# Patient Record
Sex: Female | Born: 1990 | Race: White | Hispanic: No | Marital: Single | Smoking: Never smoker
Health system: Southern US, Community
[De-identification: ages and names within clinical notes are randomized; demographics above are authoritative.]

## PROBLEM LIST (undated history)

## (undated) DIAGNOSIS — M26609 Unspecified temporomandibular joint disorder, unspecified side: Secondary | ICD-10-CM

## (undated) HISTORY — PX: TONSILLECTOMY AND ADENOIDECTOMY: SUR1326

## (undated) HISTORY — PX: TONSILLECTOMY: SUR1361

## (undated) HISTORY — PX: ARTHROSCOPIC REPAIR ACL: SUR80

---

## 2015-10-10 ENCOUNTER — Other Ambulatory Visit: Payer: Self-pay | Admitting: Family Medicine

## 2015-10-10 ENCOUNTER — Emergency Department (INDEPENDENT_AMBULATORY_CARE_PROVIDER_SITE_OTHER)
Admission: EM | Admit: 2015-10-10 | Discharge: 2015-10-10 | Disposition: A | Discharge status: Home / Self Care | Payer: 59 | Source: Home / Self Care | Attending: Family Medicine | Admitting: Family Medicine

## 2015-10-10 ENCOUNTER — Encounter: Payer: Self-pay | Admitting: Emergency Medicine

## 2015-10-10 DIAGNOSIS — R509 Fever, unspecified: Secondary | ICD-10-CM | POA: Diagnosis not present

## 2015-10-10 DIAGNOSIS — K1121 Acute sialoadenitis: Secondary | ICD-10-CM

## 2015-10-10 HISTORY — DX: Unspecified temporomandibular joint disorder, unspecified side: M26.609

## 2015-10-10 LAB — POCT CBC W AUTO DIFF (K'VILLE URGENT CARE)

## 2015-10-10 MED ORDER — CLINDAMYCIN HCL 300 MG PO CAPS: 300.0000 mg | ORAL_CAPSULE | Freq: Three times a day (TID) | ORAL | Status: DC

## 2015-10-10 NOTE — Discharge Instructions (Signed)
Increase fluid intake.  May begin applying warm compress several times daily.  May take Tylenol for pain, or Ibuprofen 200mg , 4 tabs every 8 hours with food.   If symptoms become significantly worse during the night or over the weekend, proceed to the local emergency room.    Parotitis Parotitis is soreness and inflammation of one or both parotid glands. The parotid glands produce saliva. They are located on each side of the face, below and in front of the earlobes. The saliva produced comes out of tiny openings (ducts) inside the cheeks. In most cases, parotitis goes away over time or with treatment. If your parotitis is caused by certain long-term (chronic) diseases, it may come back again.  CAUSES  Parotitis can be caused by:  Viral infections. Mumps is one viral infection that can cause parotitis.  Bacterial infections.  Blockage of the salivary ducts due to a salivary stone.  Narrowing of the salivary ducts.  Swelling of the salivary ducts.  Dehydration.  Autoimmune conditions, such as sarcoidosis or Sjogren syndrome.  Air from activities such as scuba diving, glass blowing, or playing an instrument (rare).  Human immunodeficiency virus (HIV) or acquired immunodeficiency syndrome (AIDS).  Tuberculosis. SIGNS AND SYMPTOMS   The ears may appear to be pushed up and out from their normal position.  Redness (erythema) of the skin over the parotid glands.  Pain and tenderness over the parotid glands.  Swelling in the parotid gland area.  Yellowish-white fluid (pus) coming from the ducts inside the cheeks.  Dry mouth.  Bad taste in the mouth. DIAGNOSIS  Your health care provider may determine that you have parotitis based on your symptoms and a physical exam. A sample of fluid may also be taken from the parotid gland and tested to find the cause of your infection. X-rays or computed tomography (CT) scans may be taken if your health care provider thinks you might have a  salivary stone blocking your salivary duct. TREATMENT  Treatment varies depending upon the cause of your parotitis. If your parotitis is caused by mumps, no treatment is needed. The condition will go away on its own after 7 to 10 days. In other cases, treatment may include:  Antibiotic medicine if your infection was caused by bacteria.  Pain medicines.  Gland massage.  Eating sour candy to increase your saliva production.  Removal of salivary stones. Your health care provider may flush stones out with fluids or remove them with tweezers.  Surgery to remove the parotid glands. HOME CARE INSTRUCTIONS   If you were prescribed an antibiotic medicine, finish it all even if you start to feel better.  Put warm compresses on the sore area.  Take medicines only as directed by your health care provider.  Drink enough fluids to keep your urine clear or pale yellow. SEEK IMMEDIATE MEDICAL CARE IF:   You have increasing pain or swelling that is not controlled with medicine.  You have a fever. MAKE SURE YOU:  Understand these instructions.  Will watch your condition.  Will get help right away if you are not doing well or get worse.   This information is not intended to replace advice given to you by your health care provider. Make sure you discuss any questions you have with your health care provider.   Document Released: 11/15/2001 Document Revised: 06/16/2014 Document Reviewed: 10/19/2014 Elsevier Interactive Patient Education [employer]2016 [employer] Inc.

## 2015-10-10 NOTE — ED Notes (Signed)
Right jaw pain, swelling for 3 days, worse today has spread into right side of face and neck. Saw PCP today who dx'd TMJ, which she says she has always had but never this severe. Rx for Naproxen 500mg , hasn't helped.

## 2015-10-10 NOTE — ED Notes (Signed)
Peggy Hook @ [facility] advised, viral swab code [phone]#[id number] Mumps, PCR, refrigerated, source

## 2015-10-10 NOTE — ED Provider Notes (Signed)
CSN: [id number]     Arrival date & time 10/10/15  1559 History   First MD Initiated Contact with Patient 10/10/15 1626     Chief Complaint  Patient presents with  . Jaw Pain      HPI Comments: Patient complains of onset of pain around her right ear about 3 days ago.  She visited her PCP who diagnosed TMJ inflammation and prescribed Naproxen 500mg  BID.  She has now developed increased pain and swelling extending to her right side of face and neck.  She has had TMJ problems in the past, but not as severe.  No fever, but she has had chills.  The history is provided by the patient.    Past Medical History  Diagnosis Date  . TMJ (temporomandibular joint syndrome)    Past Surgical History  Procedure Laterality Date  . Tonsillectomy    . Arthroscopic repair acl     Family History  Problem Relation Age of Onset  . Cancer Mother   . Diabetes Father   . Hypertension Father    Social History  Substance Use Topics  . Smoking status: Never Smoker   . Smokeless tobacco: None  . Alcohol Use: Yes   OB History    No data available     Review of Systems  Constitutional: Positive for chills. Negative for fever, diaphoresis, appetite change and fatigue.  HENT: Positive for ear pain and facial swelling. Negative for congestion, dental problem, ear discharge, hearing loss, rhinorrhea, sinus pressure, sore throat, tinnitus and trouble swallowing.   Eyes: Negative.   Respiratory: Negative.   Cardiovascular: Negative.   Gastrointestinal: Negative.   Genitourinary: Negative.   Musculoskeletal: Negative.   Skin: Negative.   Neurological: Negative for headaches.    Allergies  Sulfa antibiotics  Home Medications   Prior to Admission medications   Medication Sig Start Date End Date Taking? Authorizing Provider  amphetamine-dextroamphetamine (ADDERALL XR) 20 MG 24 hr capsule Take 20 mg by mouth daily.   Yes Historical Provider, MD  escitalopram (LEXAPRO) 20 MG tablet Take 20 mg by mouth  daily.   Yes Historical Provider, MD  naproxen (EC NAPROSYN) 500 MG EC tablet Take 500 mg by mouth 2 (two) times daily with a meal.   Yes Historical Provider, MD  clindamycin (CLEOCIN) 300 MG capsule Take 1 capsule (300 mg total) by mouth 3 (three) times daily. 10/10/15   Lattie HawStephen A Lara Palinkas, MD   Meds Ordered and Administered this Visit  Medications - No data to display  BP 136/84 mmHg  Pulse 92  Temp(Src) 99 F (37.2 C) (Oral)  Ht 5\' 7"  (1.702 m)  Wt 158 lb (71.668 kg)  BMI 24.74 kg/m2  SpO2 100%  LMP 09/05/2015 No data found.   Physical Exam  Constitutional: She is oriented to person, place, and time. She appears well-developed and well-nourished. No distress.  HENT:  Head:    Right Ear: Tympanic membrane, external ear and ear canal normal.  Left Ear: Tympanic membrane, external ear and ear canal normal.  Nose: Nose normal.  Mouth/Throat: Oropharynx is clear and moist. No trismus in the jaw.  There is tenderness to palpation and mild swelling over the right TMJ and parotid gland.  There is right cervical adenopathy as noted on diagram.    Eyes: Conjunctivae are normal. Pupils are equal, round, and reactive to light.  Neck: Neck supple.  Cardiovascular: Normal heart sounds.   Pulmonary/Chest: Breath sounds normal.  Abdominal: There is no tenderness.  Neurological: She  is alert and oriented to person, place, and time.  Skin: Skin is warm and dry. No rash noted.  Nursing note and vitals reviewed.   ED Course  Procedures none    Labs Reviewed  OTHER LAB TEST  MUMPS ANTIBODY, IGG  MUMPS ANTIBODY, IGM POCT CBC:  WBC 10.3; LY 16.1; MO 3.2; GR 80.7; Hgb 12.5; Platelets 239       MDM   1. Fever, unspecified   2. Parotitis, acute; with a normal WBC, suspect mumps.  Also consider Stensens duct stone.    Mumps IgG, IgM, and mumps viral PCR (from Stensens duct swab) pending. Begin empiric Clindamycin  TID Increase fluid intake.  May begin applying warm compress several  times daily.  May take Tylenol for pain, or Ibuprofen , 4 tabs every 8 hours with food.   If symptoms become significantly worse during the night or over the weekend, proceed to the local emergency room.  Recheck in 48 hours.    Lattie Haw, MD 10/17/15 [phone]

## 2015-10-11 LAB — MUMPS ANTIBODY, IGG: Mumps IgG: 47.4 AU/mL — ABNORMAL HIGH (ref <9.00)

## 2015-10-13 LAB — MUMPS ANTIBODY, IGM

## 2015-10-15 ENCOUNTER — Telehealth: Payer: Self-pay | Admitting: *Deleted

## 2015-10-17 LAB — OTHER SOLSTAS TEST: COST - Referred Assay: 70172

## 2016-03-31 ENCOUNTER — Emergency Department (INDEPENDENT_AMBULATORY_CARE_PROVIDER_SITE_OTHER)
Admission: EM | Admit: 2016-03-31 | Discharge: 2016-03-31 | Disposition: A | Discharge status: Home / Self Care | Payer: 59 | Source: Home / Self Care | Attending: Family Medicine | Admitting: Family Medicine

## 2016-03-31 ENCOUNTER — Encounter: Payer: Self-pay | Admitting: *Deleted

## 2016-03-31 ENCOUNTER — Emergency Department (INDEPENDENT_AMBULATORY_CARE_PROVIDER_SITE_OTHER): Payer: 59

## 2016-03-31 DIAGNOSIS — R112 Nausea with vomiting, unspecified: Secondary | ICD-10-CM

## 2016-03-31 DIAGNOSIS — R0789 Other chest pain: Secondary | ICD-10-CM | POA: Diagnosis not present

## 2016-03-31 DIAGNOSIS — R05 Cough: Secondary | ICD-10-CM

## 2016-03-31 DIAGNOSIS — K921 Melena: Secondary | ICD-10-CM

## 2016-03-31 DIAGNOSIS — R5383 Other fatigue: Secondary | ICD-10-CM | POA: Diagnosis not present

## 2016-03-31 LAB — POCT CBC W AUTO DIFF (K'VILLE URGENT CARE)

## 2016-03-31 LAB — TSH: TSH: 1.26 mIU/L

## 2016-03-31 MED ORDER — ONDANSETRON 4 MG PO TBDP
4.0000 mg | ORAL_TABLET | Freq: Once | ORAL | Status: AC
  Administered 2016-03-31: 4 mg via ORAL

## 2016-03-31 MED ORDER — ONDANSETRON 4 MG PO TBDP: ORAL_TABLET | ORAL | 0 refills | Status: AC

## 2016-03-31 NOTE — Discharge Instructions (Signed)
Begin clear liquids for about 12 hours, then may begin a BRAT diet (Bananas, Rice, Applesauce, Toast) when nausea and vomiting resolved.  Then gradually advance to a regular diet as tolerated.  Avoid milk products until well.   If cold-like symptoms develop, try the following: Take plain guaifenesin (1200mg  extended release tabs such as Mucinex) twice daily, with plenty of water, for cough and congestion.  May add Pseudoephedrine (30mg , one or two every 4 to 6 hours) for sinus congestion.  Get adequate rest.   May use Afrin nasal spray (or generic oxymetazoline) twice daily for about 5 days and then discontinue.  Also recommend using saline nasal spray several times daily and saline nasal irrigation (AYR is a common brand).  Use Flonase nasal spray each morning after using Afrin nasal spray and saline nasal irrigation. Try warm salt water gargles for sore throat.  Stop all antihistamines for now, and other non-prescription cough/cold preparations. May take Delsym Cough Suppressant at bedtime for nighttime cough.  May take Tylenol as needed for fever, headache, sore throat, etc.    Follow-up with family doctor if not improving about 7 to 10 days.

## 2016-03-31 NOTE — ED Provider Notes (Signed)
[facility]    CSN: [id number] Arrival date & time: 03/31/16  1015     History   Chief Complaint Chief Complaint  Patient presents with  . Nausea    HPI Earl LitesJessica Butler is a 25 y.o. female.   Patient awoke at 1am today with nausea and "dry heaves," but did not actually vomit.  She also had a sensation of tightness /pressure in her anterior chest, scratchy throat, fatigue, myalgias, and lower abdominal pain.  She states that she has been fatigued for more than a week, and has been under increased stress.  She reports that mold was recently discovered in her home heating system. She complains of intermittent bright red blood in her bowel movements, without anal/rectal pain.  About 4 to 5 years ago while in college she developed abdominal pain as the result of taking frequent ibuprofen.  Endoscopy at that time revealed stomach ulcer, and colonoscopy revealed hemorrhoids.   Patient's last menstrual period was 03/31/2016.  The history is provided by the patient and a parent.     Past Medical History:  Diagnosis Date  . TMJ (temporomandibular joint syndrome)     There are no active problems to display for this patient.   Past Surgical History:  Procedure Laterality Date  . ARTHROSCOPIC REPAIR ACL    . TONSILLECTOMY    . TONSILLECTOMY AND ADENOIDECTOMY      OB History    No data available       Home Medications    Prior to Admission medications   Medication Sig Start Date End Date Taking? Authorizing Provider  norethindrone-ethinyl estradiol-iron (ESTROSTEP FE,TILIA FE,TRI-LEGEST FE) 1-20/1-30/1-35 MG-MCG tablet Take 1 tablet by mouth daily.   Yes Historical Provider, MD  amphetamine-dextroamphetamine (ADDERALL XR) 20 MG 24 hr capsule Take 20 mg by mouth daily.    Historical Provider, MD  ondansetron (ZOFRAN ODT) 4 MG disintegrating tablet Take one tab by mouth Q6hr prn nausea 03/31/16   Lattie Haw, MD    Family History Family History  Problem Relation Age of Onset  . Cancer Mother   . Diabetes Father   . Hypertension Father     Social History Social History  Substance Use Topics  . Smoking status: Never Smoker  . Smokeless tobacco: Never Used  . Alcohol use Yes     Allergies   Sulfa antibiotics   Review of Systems Review of Systems ? sore throat No cough No pleuritic pain, but feels tightness in anterior chest No wheezing + nasal congestion ? post-nasal drainage No sinus pain/pressure No itchy/red eyes No earache No hemoptysis No SOB No fever, + chills + nausea + vomiting + abdominal pain No diarrhea No urinary symptoms No skin rash + fatigue + myalgias + headache Used OTC meds without relief   Physical Exam Triage Vital Signs ED Triage Vitals  Enc Vitals Group     BP 03/31/16 1052 121/85     Pulse Rate 03/31/16 1052 93     Resp --      Temp 03/31/16 1052 98.1 F (36.7 C)     Temp Source 03/31/16 1052 Oral     SpO2 03/31/16 1052 100 %     Weight 03/31/16 1052 154 lb (69.9 kg)     Height --       Head Circumference --      Peak Flow --      Pain Score 03/31/16 1055 2     Pain Loc --      Pain Edu? --      Excl. in GC? --    No data found.   Updated Vital Signs BP 121/85 (BP Location: Left Arm)   Pulse 93   Temp 98.1 F (36.7 C) (Oral)   Wt 154 lb (69.9 kg)   LMP 03/31/2016   SpO2 100%   BMI 24.12 kg/m   Visual Acuity Right Eye Distance:   Left Eye Distance:   Bilateral Distance:    Right Eye Near:   Left Eye Near:    Bilateral Near:     Physical Exam Nursing notes and Vital Signs reviewed. Appearance:  Patient appears stated age, and in no acute distress Eyes:  Pupils are equal, round, and reactive to light and accomodation.  Extraocular movement is intact.  Conjunctivae are not inflamed  Ears:  Canals normal.  Tympanic membranes normal.  Nose:  Mildly congested turbinates.  No sinus tenderness.  Pharynx:  Normal Neck:  Supple.  Tender enlarged posterior/lateral nodes  are palpated bilaterally  Lungs:  Clear to auscultation.  Breath sounds are equal.  Moving air well.  No tenderness over sternum. Heart:  Regular rate and rhythm without murmurs, rubs, or gallops.  Abdomen:  Mild diffuse tenderness without masses or hepatosplenomegaly.  Bowel sounds are present.  No CVA or flank tenderness.  Extremities:  No edema.  Skin:  No rash present.    UC Treatments / Results  Labs (all labs ordered are listed, but only abnormal results are displayed) Labs Reviewed  TSH  POCT CBC W AUTO DIFF ([facility]):  WBC 7.7; LY 31.5; MO 4.8; GR 63.7; Hgb 14.4; Platelets 339     EKG  EKG Interpretation None       Radiology Dg Chest 2 View  Result Date: 03/31/2016 CLINICAL DATA:  Anterior chest tightness with cough for 2-3 weeks. EXAM: CHEST  2 VIEW COMPARISON:  None. FINDINGS: Heart and mediastinal contours are within normal limits. No focal opacities or effusions. No acute bony abnormality. IMPRESSION: No active cardiopulmonary disease. Electronically Signed    By: Charlett Nose M.D.   On: 03/31/2016 12:48    Procedures Procedures (including critical care time)  Medications Ordered in UC Medications  ondansetron (ZOFRAN-ODT) disintegrating tablet 4 mg (4 mg Oral Given 03/31/16 1056)     Initial Impression / Assessment and Plan / UC Course  I have reviewed the triage vital signs and the nursing notes.  Pertinent labs & imaging results that were available during my care of the patient were reviewed by me and considered in my medical decision making (see chart for details).  Clinical Course  Administered Zofran ODT 4mg  PO.  Given Rx for same. Normal CBC and chest x-ray reassuring. TSH pending. Suspect viral syndrome.  Treat symptomatically for now. Begin clear liquids for about 12 hours, then may begin a BRAT diet (Bananas, Rice, Applesauce, Toast) when nausea and vomiting resolved.  Then gradually advance to a regular diet as tolerated.  Avoid milk products until well.   If cold-like symptoms develop, try the following: Take plain guaifenesin (1200mg  extended release tabs such as Mucinex) twice daily, with plenty of water, for cough and congestion.  May add Pseudoephedrine (30mg , one or two every 4 to 6 hours) for sinus congestion.  Get adequate rest.   May use Afrin nasal spray (or generic oxymetazoline) twice daily for about 5 days and then discontinue.  Also recommend using saline nasal spray several times daily and saline nasal irrigation (AYR is a common brand).  Use Flonase nasal spray each morning after using Afrin nasal spray and saline nasal irrigation. Try warm salt water gargles for sore throat.  Stop all antihistamines for now, and other non-prescription cough/cold preparations. May take Delsym Cough Suppressant at bedtime for nighttime cough.  May take Tylenol as needed for fever, headache, sore throat, etc.    Follow-up with family doctor if not improving about 7 to 10 days.   Recommend follow-up with PCP to establish care.   With history of hematochezia, recommend evaluation by gastroenterologist.     Final Clinical Impressions(s) / UC Diagnoses   Final diagnoses:  Fatigue, unspecified type  Hematochezia  Nausea and vomiting in adult    New Prescriptions New Prescriptions   ONDANSETRON (ZOFRAN ODT) 4 MG DISINTEGRATING TABLET    Take one tab by mouth Q6hr prn nausea     Lattie Haw, MD 03/31/16 1352

## 2016-03-31 NOTE — ED Triage Notes (Signed)
Since 1am this morning c/o nausea, chest burning and central abdominal pain. She reports dry heaving but never vomiting. Afebrile. She recently found mold in her home and in her vent system.

## 2016-04-01 ENCOUNTER — Telehealth: Payer: Self-pay | Admitting: *Deleted

## 2016-04-01 NOTE — Telephone Encounter (Signed)
Callback: Patient advised of TSH level. She reports today she was feeling better but now starting to decline again. Encouraged rest, zofran, and slowly advancing her diet. Call back as needed.

## 2016-04-07 ENCOUNTER — Other Ambulatory Visit (HOSPITAL_BASED_OUTPATIENT_CLINIC_OR_DEPARTMENT_OTHER): Payer: Self-pay

## 2016-04-07 ENCOUNTER — Emergency Department (INDEPENDENT_AMBULATORY_CARE_PROVIDER_SITE_OTHER)
Admission: EM | Admit: 2016-04-07 | Discharge: 2016-04-07 | Disposition: A | Discharge status: Home / Self Care | Payer: 59 | Source: Home / Self Care | Attending: Family Medicine | Admitting: Family Medicine

## 2016-04-07 ENCOUNTER — Ambulatory Visit (HOSPITAL_BASED_OUTPATIENT_CLINIC_OR_DEPARTMENT_OTHER): Payer: 59

## 2016-04-07 ENCOUNTER — Encounter (HOSPITAL_BASED_OUTPATIENT_CLINIC_OR_DEPARTMENT_OTHER): Payer: Self-pay

## 2016-04-07 ENCOUNTER — Encounter: Payer: Self-pay | Admitting: Emergency Medicine

## 2016-04-07 ENCOUNTER — Other Ambulatory Visit (HOSPITAL_BASED_OUTPATIENT_CLINIC_OR_DEPARTMENT_OTHER): Payer: 59

## 2016-04-07 ENCOUNTER — Ambulatory Visit (HOSPITAL_BASED_OUTPATIENT_CLINIC_OR_DEPARTMENT_OTHER)
Admission: RE | Admit: 2016-04-07 | Discharge: 2016-04-07 | Disposition: A | Discharge status: Home / Self Care | Payer: BLUE CROSS/BLUE SHIELD | Source: Ambulatory Visit | Attending: Family Medicine | Admitting: Family Medicine

## 2016-04-07 DIAGNOSIS — R1084 Generalized abdominal pain: Secondary | ICD-10-CM

## 2016-04-07 DIAGNOSIS — R1012 Left upper quadrant pain: Secondary | ICD-10-CM | POA: Diagnosis not present

## 2016-04-07 DIAGNOSIS — Z8719 Personal history of other diseases of the digestive system: Secondary | ICD-10-CM

## 2016-04-07 DIAGNOSIS — Z8711 Personal history of peptic ulcer disease: Secondary | ICD-10-CM

## 2016-04-07 DIAGNOSIS — R109 Unspecified abdominal pain: Secondary | ICD-10-CM | POA: Diagnosis present

## 2016-04-07 DIAGNOSIS — K921 Melena: Secondary | ICD-10-CM

## 2016-04-07 LAB — COMPLETE METABOLIC PANEL WITH GFR
ALT: 9 U/L (ref 6–29)
AST: 17 U/L (ref 10–30)
Albumin: 4.2 g/dL (ref 3.6–5.1)
Alkaline Phosphatase: 34 U/L (ref 33–115)
BUN: 14 mg/dL (ref 7–25)
CO2: 24 mmol/L (ref 20–31)
Calcium: 9.2 mg/dL (ref 8.6–10.2)
Chloride: 104 mmol/L (ref 98–110)
Creat: 0.76 mg/dL (ref 0.50–1.10)
GFR, Est African American: >89 mL/min (ref >=60)
GFR, Est Non African American: >89 mL/min (ref >=60)
Glucose, Bld: 76 mg/dL (ref 65–99)
Potassium: 4 mmol/L (ref 3.5–5.3)
Sodium: 137 mmol/L (ref 135–146)
Total Bilirubin: 0.4 mg/dL (ref 0.2–1.2)
Total Protein: 7.2 g/dL (ref 6.1–8.1)

## 2016-04-07 LAB — POCT URINALYSIS DIP (MANUAL ENTRY)
Bilirubin, UA: NEGATIVE
Blood, UA: NEGATIVE
Glucose, UA: NEGATIVE
Ketones, POC UA: NEGATIVE
Leukocytes, UA: NEGATIVE
Nitrite, UA: NEGATIVE
Protein Ur, POC: NEGATIVE
Spec Grav, UA: 1.01 (ref 1.005–1.03)
Urobilinogen, UA: 0.2 (ref 0–1)
pH, UA: 7 (ref 5–8)

## 2016-04-07 LAB — POCT URINE PREGNANCY: Preg Test, Ur: NEGATIVE

## 2016-04-07 LAB — LIPASE: Lipase: 17 U/L (ref 7–60)

## 2016-04-07 MED ORDER — IOPAMIDOL (ISOVUE-300) INJECTION 61%
100.0000 mL | Freq: Once | INTRAVENOUS | Status: AC | PRN
  Administered 2016-04-07: 100 mL via INTRAVENOUS

## 2016-04-07 MED ORDER — OMEPRAZOLE 20 MG PO CPDR: 20.0000 mg | DELAYED_RELEASE_CAPSULE | Freq: Two times a day (BID) | ORAL | 0 refills | Status: AC

## 2016-04-07 NOTE — ED Provider Notes (Signed)
CSN: [id number]     Arrival date & time 04/07/16  1459 History   First MD Initiated Contact with Patient 04/07/16 1520     Chief Complaint  Patient presents with  . Abdominal Pain   (Consider location/radiation/quality/duration/timing/severity/associated sxs/prior Treatment) HPI  Rita Butler is a 25 y.o. female presenting to UC with c/o LUQ abdominal pain that is moderate to severe after eating.  Pain has been going on for about 1-2 months.  Pain is sharp and burning, radiates to bilateral upper back at times.  Pain is 7/10 at this time.  She was seen at [facility] for abdominal pain and nausea about 1 week ago.  Advised symptoms were likely viral in nature but pain has only worsened. She was advised to f/u with PCP but cannot get in for a new-patient visit until November 21st.  She has not tried any OTC medications for the pain. She reports being dx with a stomach ulcer via endoscopy in [place] about 6 years ago. She does not recall being placed on any medications for that.  She does report having blood tinged stool with almost every BM but notes that has been going on for about 1 year. Denies fever, chills, vomiting or diarrhea. Denies nausea today. Denies urinary or vaginal symptoms. No hx of abdominal surgeries.    Past Medical History:  Diagnosis Date  . TMJ (temporomandibular joint syndrome)    Past Surgical History:  Procedure Laterality Date  . ARTHROSCOPIC REPAIR ACL    . TONSILLECTOMY    . TONSILLECTOMY AND ADENOIDECTOMY     Family History  Problem Relation Age of Onset  . Cancer Mother   . Diabetes Father   . Hypertension Father    Social History  Substance Use Topics  . Smoking status: Never Smoker  . Smokeless tobacco: Never Used  . Alcohol use Yes   OB History    No data available     Review of Systems  Constitutional: Negative for chills, fatigue, fever and unexpected weight change.  Cardiovascular: Negative for chest pain and palpitations.  Gastrointestinal:  Positive for abdominal pain (LUQ), blood in stool and nausea. Negative for anal bleeding, constipation, diarrhea, rectal pain and vomiting.  Genitourinary: Negative for dysuria, flank pain, frequency, hematuria, pelvic pain, urgency, vaginal bleeding, vaginal discharge and vaginal pain.  Musculoskeletal: Positive for arthralgias, back pain and myalgias.       Body aches, worse in back  Skin: Negative for color change and rash.    Allergies  Sulfa antibiotics  Home Medications   Prior to Admission medications   Medication Sig Start Date End Date Taking? Authorizing Provider  amphetamine-dextroamphetamine (ADDERALL XR) 20 MG 24 hr capsule Take 20 mg by mouth daily.    Historical Provider, MD  norethindrone-ethinyl estradiol-iron (ESTROSTEP FE,TILIA FE,TRI-LEGEST FE) 1-20/1-30/1-35 MG-MCG tablet Take 1 tablet by mouth daily.    Historical Provider, MD  omeprazole (PRILOSEC) 20 MG capsule Take 1 capsule (20 mg total) by mouth 2 (two) times daily before a meal. 04/07/16   Junius Finner, PA-C  ondansetron (ZOFRAN ODT) 4 MG disintegrating tablet Take one tab by mouth Q6hr prn nausea 03/31/16   Lattie Haw, MD   Meds Ordered and Administered this Visit  Medications - No data to display  BP 146/87 (BP Location: Left Arm)   Pulse 111   Temp 97.9 F (36.6 C) (Oral)   Ht 5\' 7"  (1.702 m)   Wt 154 lb (69.9 kg)   LMP 03/31/2016   SpO2  96%   BMI 24.12 kg/m  No data found.   Physical Exam  Constitutional: She appears well-developed and well-nourished. No distress.  HENT:  Head: Normocephalic and atraumatic.  Nose: Nose normal.  Mouth/Throat: Uvula is midline, oropharynx is clear and moist and mucous membranes are normal.  Eyes: Conjunctivae are normal. No scleral icterus.  Neck: Normal range of motion. Neck supple.  Cardiovascular: Normal rate, regular rhythm and normal heart sounds.   Pulmonary/Chest: Effort normal and breath sounds normal. No respiratory distress. She has no wheezes.  She has no rales.  Abdominal: Soft. She exhibits no distension and no mass. There is tenderness. There is no rebound, no guarding and no CVA tenderness.  Soft, non-distended. Tenderness to LUQ and epigastrium.   Musculoskeletal: Normal range of motion.  Neurological: She is alert.  Skin: Skin is warm and dry. She is not diaphoretic.  Nursing note and vitals reviewed.   Urgent Care Course   Clinical Course    Procedures (including critical care time)  Labs Review Labs Reviewed  COMPLETE METABOLIC PANEL WITH GFR   Narrative:    Performed at:  [facility]                [address], [address]                [place], [place] [address]  LIPASE   Narrative:    Performed at:  [facility]                [address], [address]                [place], [place] [address]  POCT URINALYSIS DIP (MANUAL ENTRY)  POCT URINE PREGNANCY    Imaging Review Ct Abdomen Pelvis W Contrast  Result Date: 04/07/2016 CLINICAL DATA:  Abdominal pain with nausea and vomiting for 2 months. Chronic blood in stool lung bases are clear. EXAM: CT ABDOMEN AND PELVIS WITH CONTRAST TECHNIQUE: Multidetector CT imaging of the abdomen and pelvis was performed using the standard protocol following bolus administration of intravenous contrast. Oral contrast was also administered. CONTRAST:  100mL ISOVUE-300 IOPAMIDOL (ISOVUE-300) INJECTION 61% COMPARISON:  None. FINDINGS: Lower chest: Lung bases are clear. Hepatobiliary: There is a 7 mm cyst in the posterior segment of the right lobe of the liver. More inferiorly, there is a 7 mm cyst in the anterior segment of the right lobe of the liver. There are two adjacent tiny probable cysts in the right dome of the liver. Gallbladder wall is not appreciably thickened. There is no biliary duct dilatation. Pancreas: No pancreatic mass or inflammatory focus. Spleen: No splenic lesions are evident. Adrenals/Urinary Tract: Adrenals appear unremarkable bilaterally.  Kidneys bilaterally show no demonstrable mass or hydronephrosis. No renal or ureteral calculus evident. Urinary bladder is midline without wall thickening. Stomach/Bowel: There is no appreciable bowel wall thickening or mesenteric thickening. There are loops of apparent matted bowel in the periphery of the left mid abdomen without appreciable fat in this area. This appearance is felt to represent collapsed bowel as opposed to a lesion apart from bowel. The mesenteric vessels in this area appear entirely normal. There is no evidence of bowel dilatation. No bowel obstruction evident. No free air or portal venous air. No extrinsic appearing lesion is seen affecting bowel. Vascular/Lymphatic: There is no abdominal aortic aneurysm. No vascular lesions are evident. There is no apparent adenopathy in the abdomen or pelvis. Reproductive: Uterus is anteverted. There is no pelvic mass or  pelvic fluid collection. Other: Appendix appears unremarkable. No abscess or ascites is evident in the abdomen or pelvis. Musculoskeletal: There are no blastic or lytic bone lesions. There is no abdominal wall or intramuscular lesion. IMPRESSION: Loops of apparent matted bowel in the lateral left abdomen without evidence of bowel obstruction or focal appreciable mass in this area. This finding may represent an anatomic variant. No abscess. Appendix region appears normal. No renal or ureteral calculus. No hydronephrosis. A cause for patient's symptoms has not been established with this study. Electronically Signed   By: Bretta BangWilliam  Woodruff III M.D.   On: 04/07/2016 18:49     MDM   1. Left upper quadrant pain   2. LUQ abdominal pain   3. Blood in stool   4. History of gastric ulcer   5. Generalized abdominal pain   6. Abdominal pain    Pt c/o LUQ abdominal pain, worse with eating and chronic hx of scant blood in stool.   Tenderness to LUQ.    CBC: unremarkable 1 week ago. CMP and Lipase labs: pending  [facility]  abdomen to r/o diverticulitis, pancreatitis, or other acute cause of current pain. Advised pt if normal CT, pain likely due to gastritis, and/or gastric ulcer. Rx: Omeprazole   CT abd: source of pt's symptoms found on CT, however, there is note of loops of apparent matted bowel in lateral Left abdomen w/o evidence of bowel obstruction.  May represent anatomic variant.  Consulted with emergency department [facility] at [facility], Dr. Anitra LauthPlunkett. Agrees no apparent emergent cause of pt's symptoms. Recommend GI f/u.  Discussed imaging with pt. Encouraged GI f/u however if symptoms such as pain worsens or unable to keep down fluids, encouraged her to check into emergency department. Patient verbalized understanding and agreement with treatment plan.     Junius FinnerErin O'Malley, PA-C 04/08/16 [phone]

## 2016-04-07 NOTE — ED Triage Notes (Signed)
Left side abdominal pain, radiates to back. Pain is worse after eating, she doesn't know how long it's been going on 1-2 months.

## 2016-04-08 ENCOUNTER — Telehealth: Payer: Self-pay | Admitting: Emergency Medicine

## 2016-04-08 ENCOUNTER — Telehealth: Payer: Self-pay | Admitting: *Deleted

## 2016-04-08 NOTE — Telephone Encounter (Signed)
Labs and CT normal, Kelsey sent referral to GI unless things get worse wait to hear from GI, or go to ED

## 2016-04-08 NOTE — Telephone Encounter (Signed)
Callback: No answer, voicemail box full.   *If patient calls back, I faxed referral request to digestive health specialist, they will call her. All labs WNL, she has been advised of CT results. Be re-evaluated if cannot hold down fluids or pain becomes severe.

## 2016-04-09 ENCOUNTER — Telehealth: Payer: Self-pay | Admitting: Emergency Medicine

## 2016-04-09 NOTE — Telephone Encounter (Signed)
Mother called and wanted number for GI, I left her a message for [facility] [phone]

## 2016-04-29 ENCOUNTER — Ambulatory Visit: Payer: 59 | Admitting: Physician Assistant

## 2016-07-04 ENCOUNTER — Encounter: Payer: Self-pay | Admitting: Physician Assistant

## 2016-07-08 ENCOUNTER — Encounter: Payer: Self-pay | Admitting: Physician Assistant

## 2018-10-15 IMAGING — CT CT ABD-PELV W/ CM
2 of 4 series · 16 of 46 positions shown, 18 images · IV contrast (APPLIED)
Comparison: None.

CLINICAL DATA: Abdominal pain with nausea and vomiting for 2
months. Chronic blood in stool lung bases are clear.

EXAM:
CT ABDOMEN AND PELVIS WITH CONTRAST
TECHNIQUE: Multidetector CT imaging of the abdomen and pelvis was performed
using the standard protocol following bolus administration of
intravenous contrast. Oral contrast was also administered.
CONTRAST:  100mL KFH0FW-OBB IOPAMIDOL (KFH0FW-OBB) INJECTION 61%

[Series 2: axial st · axial · 0.93mm/px · z∈[-503,-93]mm · 13 of 90 slices shown, 15 images]
[im 4/90  soft-tissue]
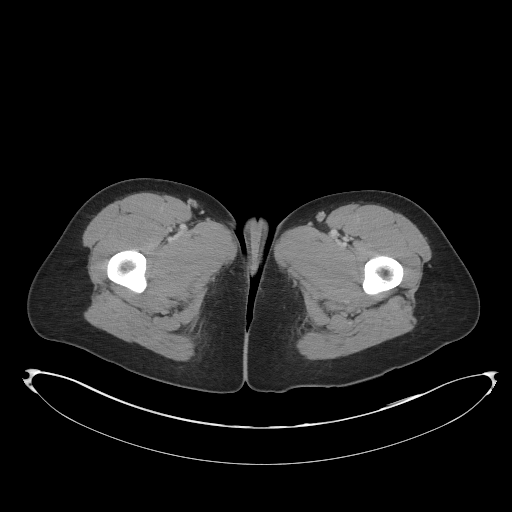
[im 4/90  bone]
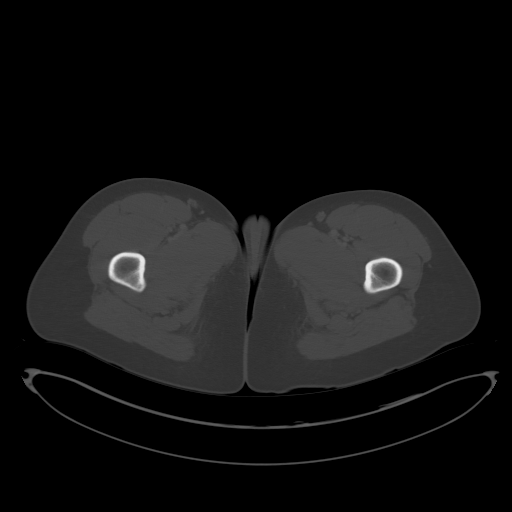
[im 12/90  soft-tissue]
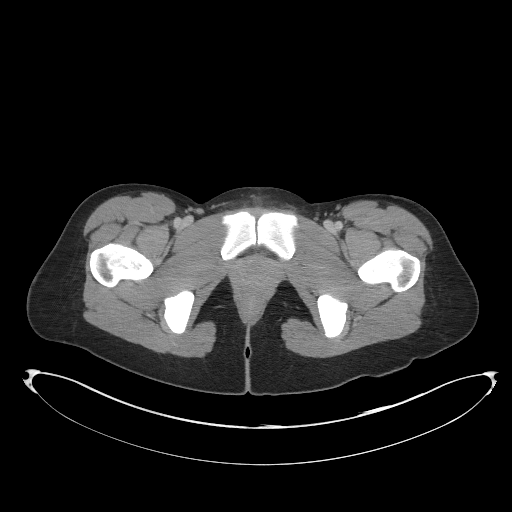
[im 19/90  soft-tissue]
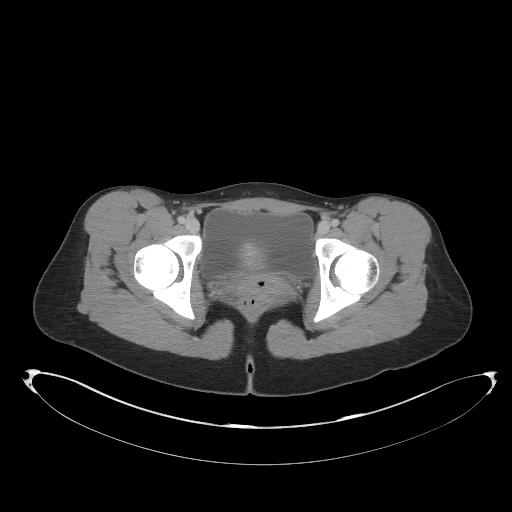
[im 26/90  soft-tissue]
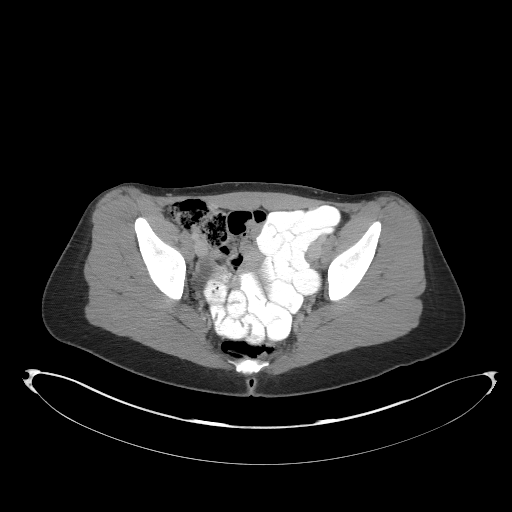
[im 30/90  soft-tissue]
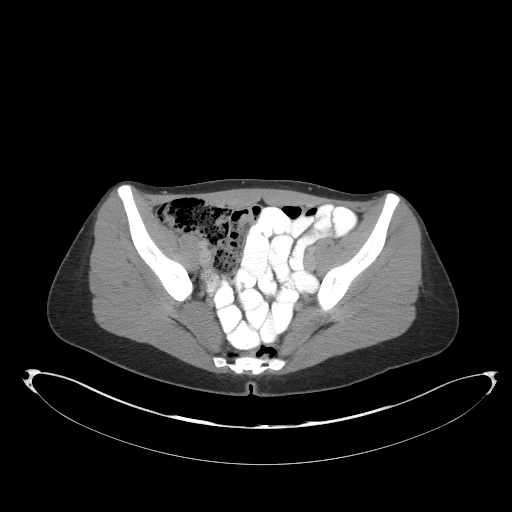
[im 38/90  soft-tissue]
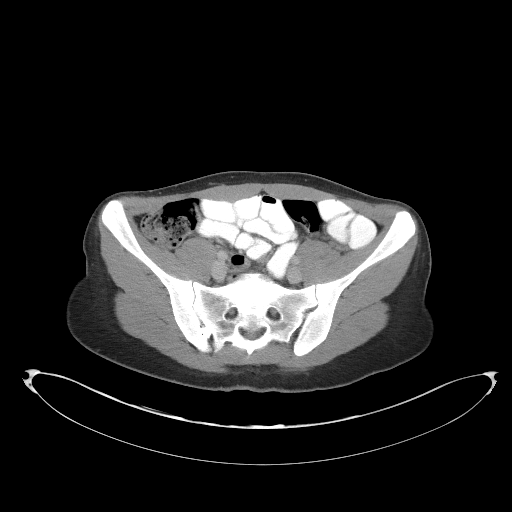
[im 45/90  soft-tissue]
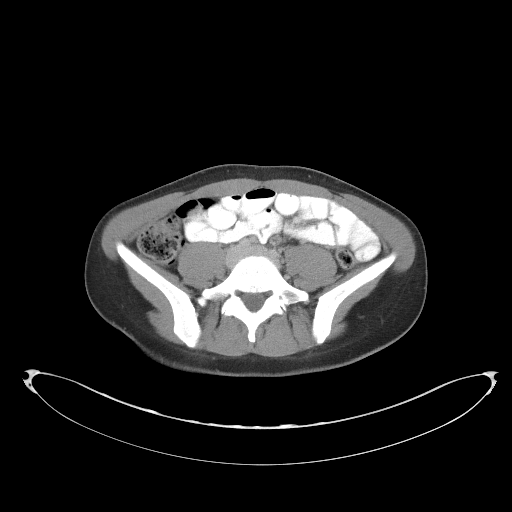
[im 52/90  soft-tissue]
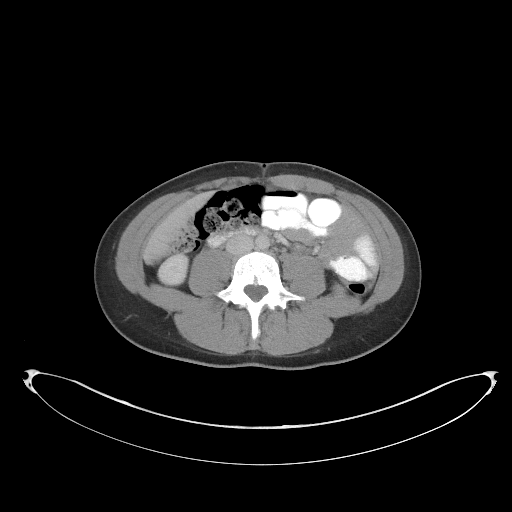
[im 60/90  soft-tissue]
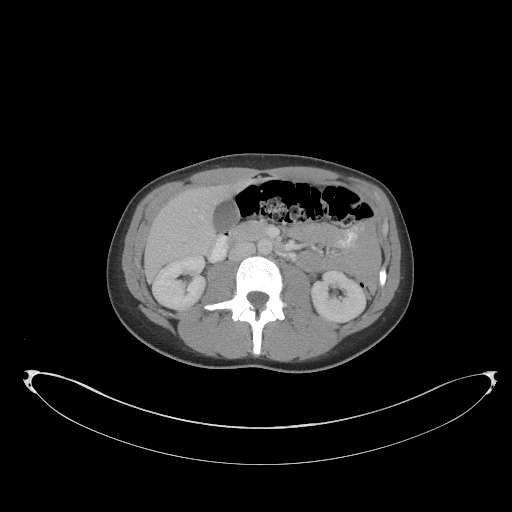
[im 60/90  bone]
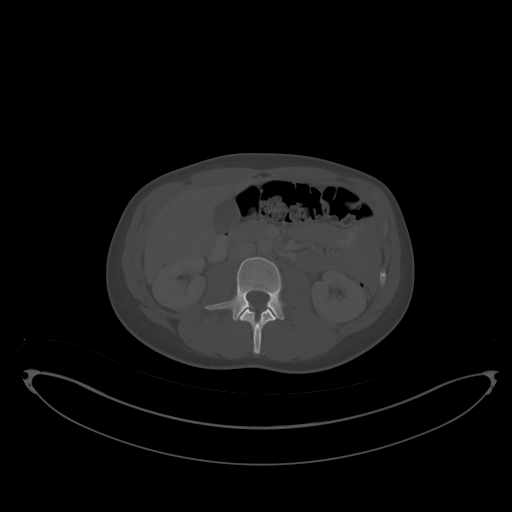
[im 64/90  soft-tissue]
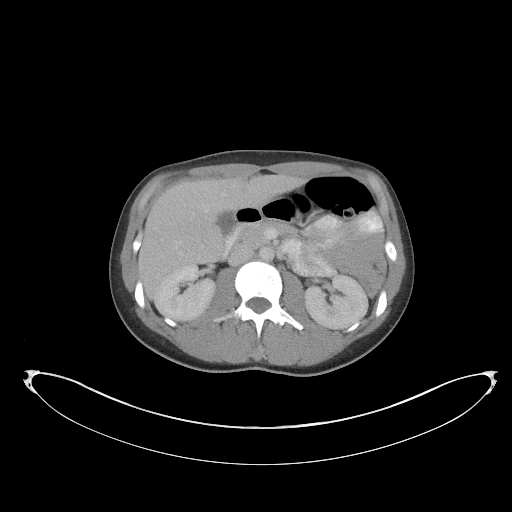
[im 71/90  soft-tissue]
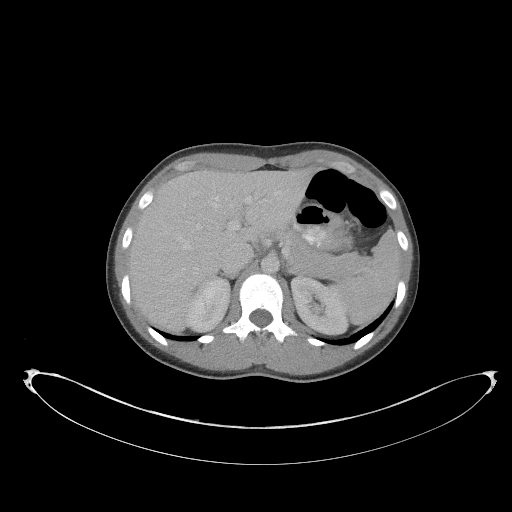
[im 78/90  soft-tissue]
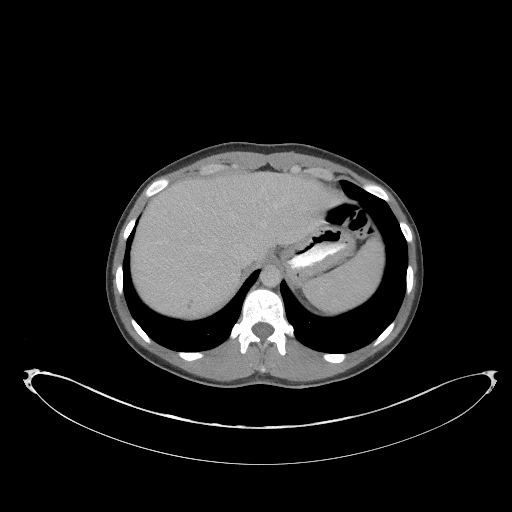
[im 86/90  soft-tissue]
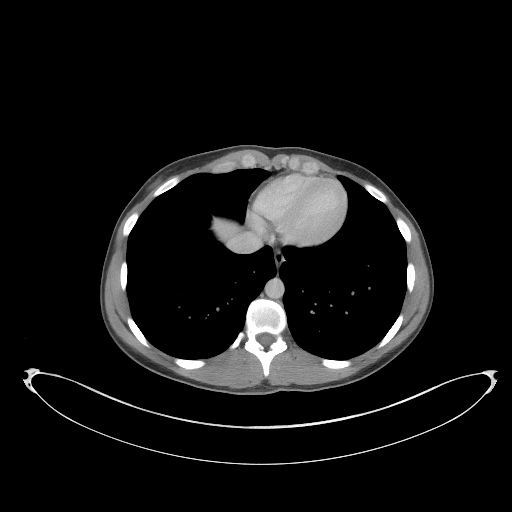

[Series 5: coronal st · coronal · 0.86mm/px · 3 of 74 slices shown]
[im 25/74  soft-tissue]
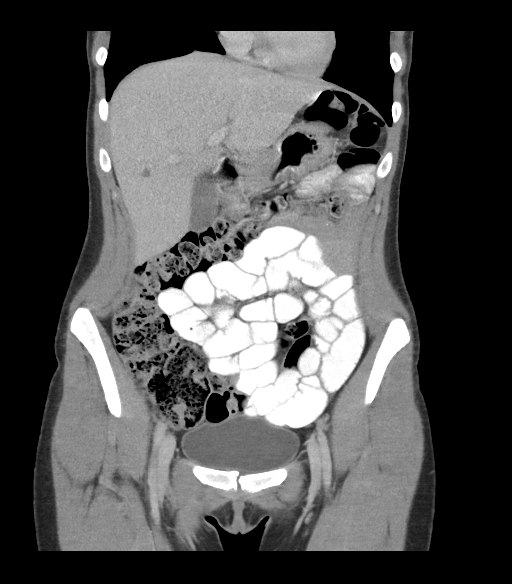
[im 33/74  soft-tissue]
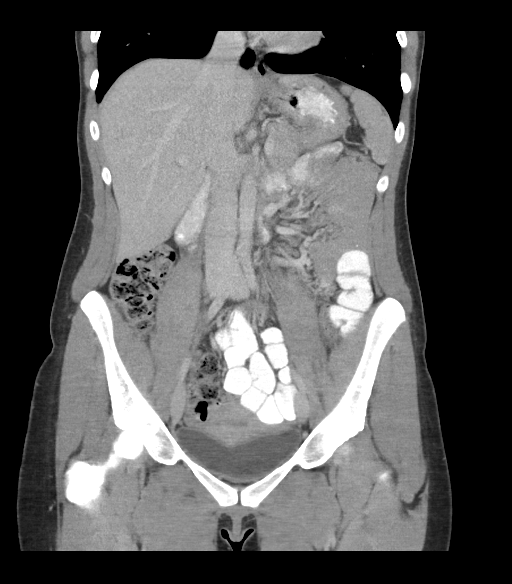
[im 41/74  soft-tissue]
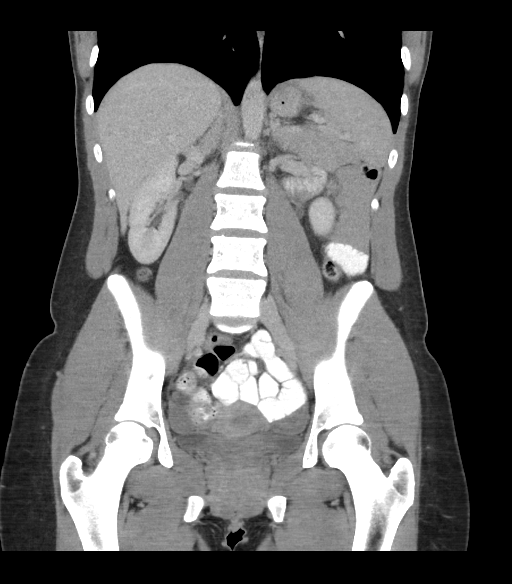

[16 of 46 positions shown; findings below may reference images not displayed]

FINDINGS: Lower chest: Lung bases are clear.

Hepatobiliary: There is a 7 mm cyst in the posterior segment of the
right lobe of the liver. More inferiorly, there is a 7 mm cyst in
the anterior segment of the right lobe of the liver. There are two
adjacent tiny probable cysts in the right dome of the liver.
Gallbladder wall is not appreciably thickened. There is no biliary
duct dilatation.

Pancreas: No pancreatic mass or inflammatory focus.

Spleen: No splenic lesions are evident.

Adrenals/Urinary Tract: Adrenals appear unremarkable bilaterally.
Kidneys bilaterally show no demonstrable mass or hydronephrosis. No
renal or ureteral calculus evident. Urinary bladder is midline
without wall thickening.

Stomach/Bowel: There is no appreciable bowel wall thickening or
mesenteric thickening. There are loops of apparent matted bowel in
the periphery of the left mid abdomen without appreciable fat in
this area. This appearance is felt to represent collapsed bowel as
opposed to a lesion apart from bowel. The mesenteric vessels in this
area appear entirely normal. There is no evidence of bowel
dilatation. No bowel obstruction evident. No free air or portal
venous air. No extrinsic appearing lesion is seen affecting bowel.

Vascular/Lymphatic: There is no abdominal aortic aneurysm. No
vascular lesions are evident. There is no apparent adenopathy in the
abdomen or pelvis.

Reproductive: Uterus is anteverted. There is no pelvic mass or
pelvic fluid collection.

Other: Appendix appears unremarkable. No abscess or ascites is
evident in the abdomen or pelvis.

Musculoskeletal: There are no blastic or lytic bone lesions. There
is no abdominal wall or intramuscular lesion.
IMPRESSION: Loops of apparent matted bowel in the lateral left abdomen without
evidence of bowel obstruction or focal appreciable mass in this
area. This finding may represent an anatomic variant.

No abscess. Appendix region appears normal. No renal or ureteral
calculus. No hydronephrosis. A cause for patient's symptoms has not
been established with this study.

## 2019-08-04 ENCOUNTER — Encounter (HOSPITAL_BASED_OUTPATIENT_CLINIC_OR_DEPARTMENT_OTHER): Payer: Self-pay

## 2019-08-04 ENCOUNTER — Emergency Department (HOSPITAL_BASED_OUTPATIENT_CLINIC_OR_DEPARTMENT_OTHER)
Admission: EM | Admit: 2019-08-04 | Discharge: 2019-08-04 | Disposition: A | Discharge status: Home / Self Care | Payer: BLUE CROSS/BLUE SHIELD | Attending: Emergency Medicine | Admitting: Emergency Medicine

## 2019-08-04 ENCOUNTER — Emergency Department (HOSPITAL_BASED_OUTPATIENT_CLINIC_OR_DEPARTMENT_OTHER): Payer: BLUE CROSS/BLUE SHIELD

## 2019-08-04 ENCOUNTER — Other Ambulatory Visit: Payer: Self-pay

## 2019-08-04 DIAGNOSIS — Y929 Unspecified place or not applicable: Secondary | ICD-10-CM | POA: Insufficient documentation

## 2019-08-04 DIAGNOSIS — W260XXA Contact with knife, initial encounter: Secondary | ICD-10-CM | POA: Diagnosis not present

## 2019-08-04 DIAGNOSIS — Y9389 Activity, other specified: Secondary | ICD-10-CM | POA: Diagnosis not present

## 2019-08-04 DIAGNOSIS — Z23 Encounter for immunization: Secondary | ICD-10-CM | POA: Diagnosis not present

## 2019-08-04 DIAGNOSIS — S61210A Laceration without foreign body of right index finger without damage to nail, initial encounter: Secondary | ICD-10-CM | POA: Diagnosis present

## 2019-08-04 DIAGNOSIS — Z79899 Other long term (current) drug therapy: Secondary | ICD-10-CM | POA: Insufficient documentation

## 2019-08-04 DIAGNOSIS — Y999 Unspecified external cause status: Secondary | ICD-10-CM | POA: Insufficient documentation

## 2019-08-04 MED ORDER — LIDOCAINE HCL (PF) 1 % IJ SOLN
5.0000 mL | Freq: Once | INTRAMUSCULAR | Status: AC
  Administered 2019-08-04: 22:00:00 5 mL
  Filled 2019-08-04: qty 5

## 2019-08-04 MED ORDER — TETANUS-DIPHTH-ACELL PERTUSSIS 5-2.5-18.5 LF-MCG/0.5 IM SUSP
0.5000 mL | Freq: Once | INTRAMUSCULAR | Status: AC
  Administered 2019-08-04: 0.5 mL via INTRAMUSCULAR
  Filled 2019-08-04: qty 0.5

## 2019-08-04 NOTE — ED Triage Notes (Signed)
Pt states she cut right index finger on kitchen knife ~1hour-lac noted-bleeding-gauze/kling applied to finger-NAD-steady gait

## 2019-08-04 NOTE — Discharge Instructions (Addendum)
You have been diagnosed today with laceration of the right index finger.  At this time there does not appear to be the presence of an emergent medical condition, however there is always the potential for conditions to change. Please read and follow the below instructions.  Please return to the Emergency Department immediately for any new or worsening symptoms. Please be sure to follow up with your Primary Care Provider within one week regarding your visit today; please call their office to schedule an appointment even if you are feeling better for a follow-up visit. Please keep the area clean and covered with sterile bandages.  You may apply a small amount of antibiotic ointment over the area for the next 2 days.  Please call the hand specialist Dr. Merlyn Lot on your discharge paperwork for follow-up as needed regarding your finger injury.  Your stitches must come out in approximately 7 days, they may be taken out by the hand specialist, and urgent care, the primary care doctor or here at the emergency department.  Get help right away if: You have very bad swelling around the wound. Your pain suddenly gets worse and is very bad. You notice painful lumps near the wound or anywhere on your body. You have a red streak going away from your wound. The wound is on your hand or foot, and: You cannot move a finger or toe. Your fingers or toes look pale or bluish. You have any new/concerning or worsening of symptoms.  Please read the additional information packets attached to your discharge summary.  Do not take your medicine if  develop an itchy rash, swelling in your mouth or lips, or difficulty breathing; call 911 and seek immediate emergency medical attention if this occurs.  Note: Portions of this text may have been transcribed using voice recognition software. Every effort was made to ensure accuracy; however, inadvertent computerized transcription errors may still be present.

## 2019-08-04 NOTE — ED Provider Notes (Signed)
[facility] Provider Note   CSN: [id number] Arrival date & time: 08/04/19  1934     History Chief Complaint  Patient presents with  . Laceration    Rita Butler is a 29 y.o. female otherwise healthy presents today with a laceration of her right index finger.  Patient reports that 1 hour prior to arrival she was cleaning a knife using a scrub brush when her hand slipped, she suffered a laceration to the dorsal aspect of her right index finger above the proximal phalanx.  She reports that she initially had a mild sharp pain to the area nonradiating worsened with palpation and improved with time and rest..  She denies any other concerns today, denies numbness/weakness, tingling, she denies any other injuries today.  She controlled bleeding with direct pressure prior to arrival.  She does not know when her last tetanus shot was but feels it was more than 5 years ago.  HPI     Past Medical History:  Diagnosis Date  . TMJ (temporomandibular joint syndrome)     There are no problems to display for this patient.   Past Surgical History:  Procedure Laterality Date  . ARTHROSCOPIC REPAIR ACL    . TONSILLECTOMY    . TONSILLECTOMY AND ADENOIDECTOMY       OB History   No obstetric history on file.     Family History  Problem Relation Age of Onset  . Cancer Mother   . Diabetes Father   . Hypertension Father     Social History   Tobacco Use  . Smoking status: Never Smoker  . Smokeless tobacco: Never Used  Substance Use Topics  . Alcohol use: Yes    Comment: occ  . Drug use: No    Home Medications Prior to Admission medications   Medication Sig Start Date End Date Taking? Authorizing Provider  amphetamine-dextroamphetamine (ADDERALL XR) 20 MG 24 hr capsule Take 20 mg by mouth daily.   Yes [provider]  norethindrone-ethinyl estradiol-iron (ESTROSTEP FE,TILIA FE,TRI-LEGEST FE) 1-20/1-30/1-35 MG-MCG tablet Take 1 tablet by  mouth daily.    [provider]  omeprazole (PRILOSEC) 20 MG capsule Take 1 capsule (20 mg total) by mouth 2 (two) times daily before a meal. 04/07/16   Noe Gens, PA-C  ondansetron (ZOFRAN ODT) 4 MG disintegrating tablet Take one tab by mouth Q6hr prn nausea 03/31/16   Kandra Nicolas, MD    Allergies    Sulfa antibiotics  Review of Systems   Review of Systems  Musculoskeletal: Negative for arthralgias and joint swelling.  Skin: Positive for wound.  Neurological: Negative for weakness and numbness.    Physical Exam Updated Vital Signs BP 132/87 (BP Location: Right Arm)   Pulse 88   Temp 98.2 F (36.8 C) (Oral)   Resp 16   Ht 5\' 7"  (1.702 m)   Wt 101.2 kg   LMP 07/31/2019   SpO2 98%   BMI 34.93 kg/m   Physical Exam Constitutional:      General: She is not in acute distress.    Appearance: Normal appearance. She is well-developed. She is not ill-appearing or diaphoretic.  HENT:     Head: Normocephalic and atraumatic.     Right Ear: External ear normal.     Left Ear: External ear normal.     Nose: Nose normal.  Eyes:     General: Vision grossly intact. Gaze aligned appropriately.     Pupils: Pupils are equal, round, and  reactive to light.  Neck:     Trachea: Trachea and phonation normal. No tracheal deviation.  Cardiovascular:     Rate and Rhythm: Normal rate and regular rhythm.     Pulses:          Radial pulses are 2+ on the right side and 2+ on the left side.  Pulmonary:     Effort: Pulmonary effort is normal. No respiratory distress.  Abdominal:     General: There is no distension.     Palpations: Abdomen is soft.     Tenderness: There is no abdominal tenderness. There is no guarding or rebound.  Musculoskeletal:        General: Normal range of motion.     Cervical back: Normal range of motion.     Comments: Right hand: 1 cm laceration overlying the dorsal aspect of the proximal phalanx of the right index finger.  No gross deformities otherwise  skin is intact.  Minimally tender to palpation directly over the laceration. No snuffbox tenderness to palpation. No tenderness to palpation over flexor sheath. Finger adduction/abduction intact with 5/5 strength.  Thumb opposition intact. Full active and resisted ROM to flexion/extension at wrist, MCP, PIP and DIP of all fingers.  FDS/FDP intact. Grip 5/5 strength.  Radial artery 2+ with <2sec cap refill in all fingers.  Sensation intact to light-tough in median/ulnar/radial distributions.  Compartments soft.  Skin:    General: Skin is warm and dry.  Neurological:     Mental Status: She is alert.     GCS: GCS eye subscore is 4. GCS verbal subscore is 5. GCS motor subscore is 6.     Comments: Speech is clear and goal oriented, follows commands Major Cranial nerves without deficit, no facial droop Moves extremities without ataxia, coordination intact  Psychiatric:        Behavior: Behavior normal.     ED Results / Procedures / Treatments   Labs (all labs ordered are listed, but only abnormal results are displayed) Labs Reviewed - No data to display  EKG None  Radiology DG Finger Index Right  Result Date: 08/04/2019 CLINICAL DATA:  Laceration EXAM: RIGHT INDEX FINGER 2+V COMPARISON:  None. FINDINGS: Frontal, oblique, lateral views of the right second digit demonstrates no fractures. No radiopaque foreign bodies. Joint spaces are well preserved. Soft tissue laceration dorsal aspect proximal second digit. On the lateral view there is a rounded metallic density overlying the dorsal aspect third metacarpal, please correlate with previous trauma. IMPRESSION: 1. Laceration second digit. No underlying fracture or radiopaque foreign body. Electronically Signed   By: Sharlet Salina M.D.   On: 08/04/2019 20:16    Procedures .Marland KitchenLaceration Repair  Date/Time: 08/04/2019 10:26 PM Performed by: Bill Salinas, PA-C Authorized by: Bill Salinas, PA-C   Consent:    Consent obtained:  Verbal    Consent given by:  Patient   Risks discussed:  Infection, need for additional repair, nerve damage, poor cosmetic result, poor wound healing, tendon damage, retained foreign body, pain and vascular damage Anesthesia (see MAR for exact dosages):    Anesthesia method:  Local infiltration   Local anesthetic:  Lidocaine 1% w/o epi Laceration details:    Location:  Finger   Finger location:  R index finger   Length (cm):  1   Depth (mm):  2 Repair type:    Repair type:  Simple Pre-procedure details:    Preparation:  Patient was prepped and draped in usual sterile fashion and imaging obtained  to evaluate for foreign bodies Exploration:    Wound exploration: wound explored through full range of motion and entire depth of wound probed and visualized     Wound extent: no foreign bodies/material noted, no muscle damage noted, no nerve damage noted, no tendon damage noted, no underlying fracture noted and no vascular damage noted   Treatment:    Area cleansed with:  Shur-Clens, saline and Betadine   Amount of cleaning:  Standard   Irrigation solution:  Sterile saline   Irrigation method:  Pressure wash Skin repair:    Repair method:  Sutures   Suture size:  5-0   Suture material:  Prolene   Suture technique:  Simple interrupted   Number of sutures:  3 Approximation:    Approximation:  Close Post-procedure details:    Dressing:  Antibiotic ointment and sterile dressing   Patient tolerance of procedure:  Tolerated well, no immediate complications   (including critical care time)  Medications Ordered in ED Medications  Tdap (BOOSTRIX) injection 0.5 mL (0.5 mLs Intramuscular Given 08/04/19 2140)  lidocaine (PF) (XYLOCAINE) 1 % injection 5 mL (5 mLs Infiltration Given by Other 08/04/19 2140)    ED Course  I have reviewed the triage vital signs and the nursing notes.  Pertinent labs & imaging results that were available during my care of the patient were reviewed by me and considered in my  medical decision making (see chart for details).    MDM Rules/Calculators/A&P                     Deeanne Deininger is a 30 y.o. female who presents to ED for laceration of the right index finger.  Tdap updated today.    DG Finger:  IMPRESSION:  1. Laceration second digit. No underlying fracture or radiopaque  foreign body.   I personally reviewed patient's x-ray and agree with radiologist interpretation.  Wound thoroughly cleaned in ED today. Wound explored and bottom of wound seen in a bloodless field. Laceration repaired as dictated above.  Full active strength and range of motion intact post repair including extension and flexion of the finger.  Sensation and capillary refill intact post repair.  No antibiotics indicated at this time.  Patient counseled on home wound care. Follow up with PCP/urgent care or return to ER for suture removal in 7 days. Patient was urged to return to the Emergency Department for worsening pain, swelling, expanding erythema especially if it streaks away from the affected area, fever, or for any additional concerns.  She has been given referral to hand specialist as needed.  At this time there does not appear to be any evidence of an acute emergency medical condition and the patient appears stable for discharge with appropriate outpatient follow up. Diagnosis was discussed with patient who verbalizes understanding of care plan and is agreeable to discharge. I have discussed return precautions with patient who verbalizes understanding of return precautions. Patient encouraged to follow-up with their PCP. All questions answered.   Note: Portions of this report may have been transcribed using voice recognition software. Every effort was made to ensure accuracy; however, inadvertent computerized transcription errors may still be present. Final Clinical Impression(s) / ED Diagnoses Final diagnoses:  Laceration of right index finger without foreign body without damage to  nail, initial encounter    Rx / DC Orders ED Discharge Orders    None       Elizabeth Palau 08/04/19 2258    Pricilla Loveless,  MD 08/05/19 1911

## 2019-08-06 ENCOUNTER — Ambulatory Visit (INDEPENDENT_AMBULATORY_CARE_PROVIDER_SITE_OTHER)
Admission: RE | Admit: 2019-08-06 | Discharge: 2019-08-06 | Disposition: A | Discharge status: Home / Self Care | Payer: BLUE CROSS/BLUE SHIELD | Source: Ambulatory Visit

## 2019-08-06 DIAGNOSIS — W260XXD Contact with knife, subsequent encounter: Secondary | ICD-10-CM

## 2019-08-06 DIAGNOSIS — L03011 Cellulitis of right finger: Secondary | ICD-10-CM | POA: Diagnosis not present

## 2019-08-06 DIAGNOSIS — S61210D Laceration without foreign body of right index finger without damage to nail, subsequent encounter: Secondary | ICD-10-CM | POA: Diagnosis not present

## 2019-08-06 MED ORDER — FLUCONAZOLE 200 MG PO TABS: 200.0000 mg | ORAL_TABLET | Freq: Once | ORAL | 0 refills | Status: AC

## 2019-08-06 MED ORDER — DOXYCYCLINE HYCLATE 100 MG PO CAPS: 100.0000 mg | ORAL_CAPSULE | Freq: Two times a day (BID) | ORAL | 0 refills | Status: AC

## 2019-08-06 NOTE — Discharge Instructions (Addendum)
Take antibiotic as directed. May take Diflucan end of antibiotic course to prevent yeast infection. Return for worsening pain, spreading of redness, inability to move finger, fever, chills, myalgias.

## 2019-08-06 NOTE — ED Provider Notes (Signed)
Virtual Visit via Video Note:  Rita Butler  initiated request for Telemedicine visit with [facility] team. I connected with Rita Butler  on 08/06/2019 at 11:46 AM  for a synchronized telemedicine visit using a video enabled HIPPA compliant telemedicine application. I verified that I am speaking with Rita Butler  using two identifiers. Rita Hall-Potvin, PA-C  was physically located in a [facility] site and Rita Butler was located at a different location.   The limitations of evaluation and management by telemedicine as well as the availability of in-person appointments were discussed. Patient was informed that she  may incur a bill ( including co-pay) for this virtual visit encounter. Rita Butler  expressed understanding and gave verbal consent to proceed with virtual visit.     History of Present Illness:Rita Butler  is a 29 y.o. female presents with concern for skin infection of right index finger.  States 2 days ago she went to ER: Records reviewed by me at time of appointment.  Had 3 simple interrupted sutures placed on right index finger for laceration caused by knife.  Tetanus updated.  Patient discharged home without antibiotics that she is immunocompetent, without significant comorbidities.  Patient states she is had persistent pain, now having swelling and redness extending towards her hand.  Has range of motion, though is states this increases pain.  Denies fever, chills, arthralgias, myalgias, dehiscence of sutures, active discharge from wound.   ROI as per HPI  Past Medical History:  Diagnosis Date  . TMJ (temporomandibular joint syndrome)     Allergies  Allergen Reactions  . Sulfa Antibiotics         Observations/Objective: 29 y.o. female sitting in no acute distress.  Patient is able to speak in full sentences without coughing, sneezing, wheezing.  Right index finger swollen, erythematous extending to MCP.  Endorsing TTP over affected  area.  Distal laceration with 3 simple interrupted sutures well approximated.  Assessment and Plan: H&P concerning for cellulitis status post laceration.  Tetanus is updated at ER.  Will start antibiotics today: Patient denies lactating, concern for pregnancy.  Endorsing vaginal yeast infections after antibiotic use: Diflucan sent.  Return precautions discussed, patient verbalized understanding and is agreeable to plan.  Follow Up Instructions: Patient is take in-person evaluation for persistent, worsening symptoms as described in patient discharge instructions.   I discussed the assessment and treatment plan with the patient. The patient was provided an opportunity to ask questions and all were answered. The patient agreed with the plan and demonstrated an understanding of the instructions.   The patient was advised to call back or seek an in-person evaluation if the symptoms worsen or if the condition fails to improve as anticipated.  I provided 15 minutes of non-face-to-face time during this encounter.    Rita Hall-Potvin, PA-C  08/06/2019 11:46 AM        Butler, [place], PA-C 08/06/19 1146

## 2022-02-10 IMAGING — DX DG FINGER INDEX 2+V*R*
3 series · 3 of 3 positions shown · non-contrast
Comparison: None.

CLINICAL DATA: Laceration

EXAM:
RIGHT INDEX FINGER 2+V

[finger ap]
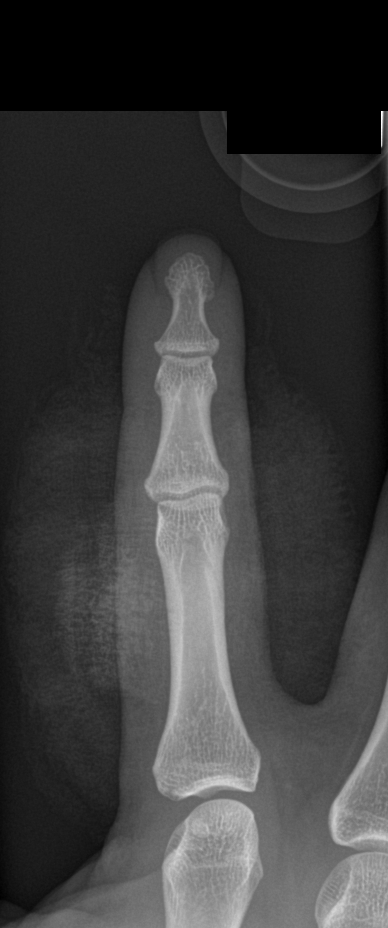

[finger obl]
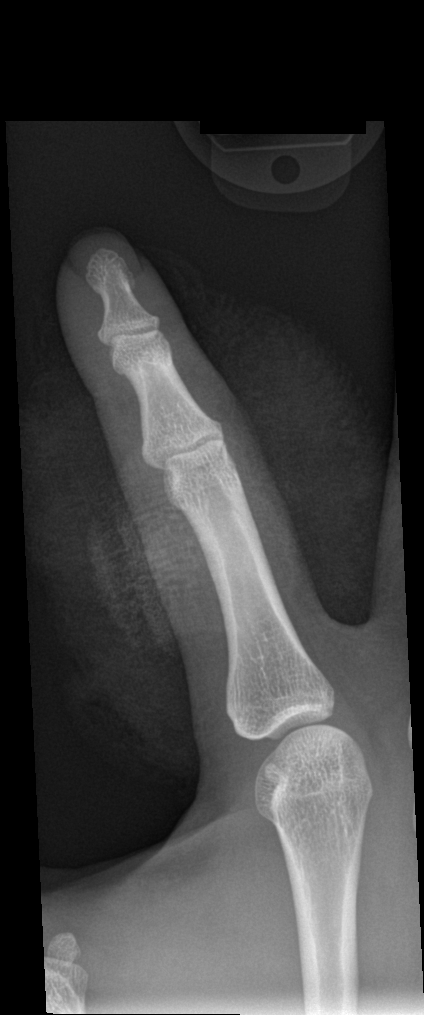

[finger lat]
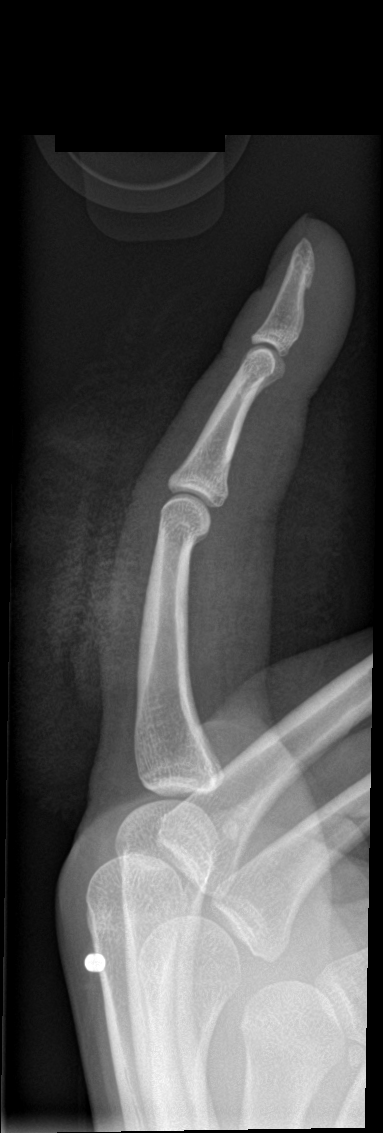

[3 of 3 positions shown; findings below may reference images not displayed]

FINDINGS: Frontal, oblique, lateral views of the right second digit
demonstrates no fractures. No radiopaque foreign bodies. Joint
spaces are well preserved. Soft tissue laceration dorsal aspect
proximal second digit.

On the lateral view there is a rounded metallic density overlying
the dorsal aspect third metacarpal, please correlate with previous
trauma.
IMPRESSION: 1. Laceration second digit. No underlying fracture or radiopaque
foreign body.
